# Patient Record
Sex: Female | Born: 1935 | Race: Black or African American | Hispanic: No | Smoking: Never smoker
Health system: Southern US, Community
[De-identification: ages and names within clinical notes are randomized; demographics above are authoritative.]

## PROBLEM LIST (undated history)

## (undated) DIAGNOSIS — J45909 Unspecified asthma, uncomplicated: Secondary | ICD-10-CM

## (undated) DIAGNOSIS — E7800 Pure hypercholesterolemia, unspecified: Secondary | ICD-10-CM

## (undated) DIAGNOSIS — I10 Essential (primary) hypertension: Secondary | ICD-10-CM

---

## 2020-01-24 ENCOUNTER — Other Ambulatory Visit: Payer: Self-pay

## 2020-01-24 ENCOUNTER — Emergency Department (HOSPITAL_BASED_OUTPATIENT_CLINIC_OR_DEPARTMENT_OTHER)
Admission: EM | Admit: 2020-01-24 | Discharge: 2020-01-24 | Disposition: A | Discharge status: Home / Self Care | Payer: Medicare Other | Attending: Emergency Medicine | Admitting: Emergency Medicine

## 2020-01-24 ENCOUNTER — Encounter (HOSPITAL_BASED_OUTPATIENT_CLINIC_OR_DEPARTMENT_OTHER): Payer: Self-pay | Admitting: Emergency Medicine

## 2020-01-24 ENCOUNTER — Emergency Department (HOSPITAL_BASED_OUTPATIENT_CLINIC_OR_DEPARTMENT_OTHER): Payer: Medicare Other

## 2020-01-24 DIAGNOSIS — J45909 Unspecified asthma, uncomplicated: Secondary | ICD-10-CM | POA: Insufficient documentation

## 2020-01-24 DIAGNOSIS — D649 Anemia, unspecified: Secondary | ICD-10-CM | POA: Diagnosis not present

## 2020-01-24 DIAGNOSIS — R0602 Shortness of breath: Secondary | ICD-10-CM | POA: Diagnosis present

## 2020-01-24 DIAGNOSIS — Z79899 Other long term (current) drug therapy: Secondary | ICD-10-CM | POA: Insufficient documentation

## 2020-01-24 DIAGNOSIS — I10 Essential (primary) hypertension: Secondary | ICD-10-CM | POA: Diagnosis not present

## 2020-01-24 HISTORY — DX: Essential (primary) hypertension: I10

## 2020-01-24 HISTORY — DX: Pure hypercholesterolemia, unspecified: E78.00

## 2020-01-24 HISTORY — DX: Unspecified asthma, uncomplicated: J45.909

## 2020-01-24 LAB — CBC WITH DIFFERENTIAL/PLATELET
Abs Immature Granulocytes: 0.09 10*3/uL — ABNORMAL HIGH (ref 0.00–0.07)
Basophils Absolute: 0.1 10*3/uL (ref 0.0–0.1)
Basophils Relative: 0 %
Eosinophils Absolute: 0.2 10*3/uL (ref 0.0–0.5)
Eosinophils Relative: 2 %
HCT: 26 % — ABNORMAL LOW (ref 36.0–46.0)
Hemoglobin: 7.5 g/dL — ABNORMAL LOW (ref 12.0–15.0)
Immature Granulocytes: 1 %
Lymphocytes Relative: 16 %
Lymphs Abs: 2.2 10*3/uL (ref 0.7–4.0)
MCH: 20.9 pg — ABNORMAL LOW (ref 26.0–34.0)
MCHC: 28.8 g/dL — ABNORMAL LOW (ref 30.0–36.0)
MCV: 72.4 fL — ABNORMAL LOW (ref 80.0–100.0)
Monocytes Absolute: 1.3 10*3/uL — ABNORMAL HIGH (ref 0.1–1.0)
Monocytes Relative: 9 %
Neutro Abs: 10.1 10*3/uL — ABNORMAL HIGH (ref 1.7–7.7)
Neutrophils Relative %: 72 %
Platelets: 619 10*3/uL — ABNORMAL HIGH (ref 150–400)
RBC: 3.59 MIL/uL — ABNORMAL LOW (ref 3.87–5.11)
RDW: 16.1 % — ABNORMAL HIGH (ref 11.5–15.5)
WBC: 13.9 10*3/uL — ABNORMAL HIGH (ref 4.0–10.5)
nRBC: 0 % (ref 0.0–0.2)

## 2020-01-24 LAB — BASIC METABOLIC PANEL
Anion gap: 11 (ref 5–15)
BUN: 21 mg/dL (ref 8–23)
CO2: 22 mmol/L (ref 22–32)
Calcium: 9.5 mg/dL (ref 8.9–10.3)
Chloride: 107 mmol/L (ref 98–111)
Creatinine, Ser: 1.23 mg/dL — ABNORMAL HIGH (ref 0.44–1.00)
GFR calc Af Amer: 46 mL/min — ABNORMAL LOW (ref >60)
GFR calc non Af Amer: 40 mL/min — ABNORMAL LOW (ref >60)
Glucose, Bld: 97 mg/dL (ref 70–99)
Potassium: 4.6 mmol/L (ref 3.5–5.1)
Sodium: 140 mmol/L (ref 135–145)

## 2020-01-24 LAB — TROPONIN I (HIGH SENSITIVITY)
Troponin I (High Sensitivity): 6 ng/L (ref <18)
Troponin I (High Sensitivity): 6 ng/L (ref <18)

## 2020-01-24 NOTE — ED Provider Notes (Signed)
[facility] EMERGENCY DEPARTMENT Provider Note   CSN: [id number] Arrival date & time: 01/24/20  1036     History Chief Complaint  Patient presents with  . Shortness of Breath    Bailey Edwards is a 86 y.o. female.  86 yo F with a chief complaints of shortness of breath on exertion.  This is been ongoing for many months.  She thinks at least more than 2.  She has been having some chest discomfort with it as well.  She has a follow-up appointment with a cardiologist in about a month's time but her family insisted that she come a bit sooner.  She does not think it is gotten significantly worse recently.  She denies history of prior MI has a history of hypertension hyperlipidemia denies smoking or family history of MI.  The history is provided by the patient.  Shortness of Breath Severity:  Moderate Onset quality:  Gradual Duration:  2 months Timing:  Intermittent Progression:  Waxing and waning Chronicity:  New Relieved by:  Rest Worsened by:  Activity and exertion Associated symptoms: chest pain   Associated symptoms: no fever, no headaches, no vomiting and no wheezing        Past Medical History:  Diagnosis Date  . Asthma   . High cholesterol   . Hypertension     There are no problems to display for this patient.   History reviewed. No pertinent surgical history.   OB History   No obstetric history on file.     No family history on file.  Social History   Tobacco Use  . Smoking status: Never Smoker  . Smokeless tobacco: Never Used  Substance Use Topics  . Alcohol use: Not Currently  . Drug use: Not on file    Home Medications Prior to Admission medications   Medication Sig Start Date End Date Taking? Authorizing Provider  albuterol (VENTOLIN HFA) 108 (90 Base) MCG/ACT inhaler Inhale into the lungs every 6 (six) hours as needed for wheezing or shortness of breath.   Yes [provider]  atorvastatin (LIPITOR) 20 MG tablet Take 20 mg  by mouth daily.   Yes [provider]  furosemide (LASIX) 20 MG tablet Take 20 mg by mouth.   Yes [provider]  metoprolol succinate (TOPROL-XL) 50 MG 24 hr tablet Take 50 mg by mouth daily. Take with or immediately following a meal.   Yes [provider]  montelukast (SINGULAIR) 10 MG tablet Take 10 mg by mouth at bedtime.   Yes [provider]  potassium chloride (MICRO-K) 10 MEQ CR capsule Take 10 mEq by mouth 2 (two) times daily.   Yes [provider]    Allergies    Patient has no known allergies.  Review of Systems   Review of Systems  Constitutional: Negative for chills and fever.  HENT: Negative for congestion and rhinorrhea.   Eyes: Negative for redness and visual disturbance.  Respiratory: Positive for shortness of breath. Negative for wheezing.   Cardiovascular: Positive for chest pain. Negative for palpitations.  Gastrointestinal: Negative for nausea and vomiting.  Genitourinary: Negative for dysuria and urgency.  Musculoskeletal: Negative for arthralgias and myalgias.  Skin: Negative for pallor and wound.  Neurological: Negative for dizziness and headaches.    Physical Exam Updated Vital Signs BP (!) 114/52 (BP Location: Right Arm)   Pulse 82   Temp 97.9 F (36.6 C) (Oral)   Resp 16   Ht 5\' 7"  (1.702 m)  Wt 75.6 kg   SpO2 99%   BMI 26.11 kg/m   Physical Exam Vitals and nursing note reviewed.  Constitutional:      General: She is not in acute distress.    Appearance: She is well-developed. She is not diaphoretic.  HENT:     Head: Normocephalic and atraumatic.  Eyes:     Pupils: Pupils are equal, round, and reactive to light.  Cardiovascular:     Rate and Rhythm: Normal rate and regular rhythm.     Heart sounds: No murmur heard.  No friction rub. No gallop.   Pulmonary:     Effort: Pulmonary effort is normal.     Breath sounds: No wheezing, rhonchi or rales.  Abdominal:     General: There is no  distension.     Palpations: Abdomen is soft.     Tenderness: There is no abdominal tenderness.  Musculoskeletal:        General: No tenderness.     Cervical back: Normal range of motion and neck supple.  Skin:    General: Skin is warm and dry.  Neurological:     Mental Status: She is alert and oriented to person, place, and time.  Psychiatric:        Behavior: Behavior normal.     ED Results / Procedures / Treatments   Labs (all labs ordered are listed, but only abnormal results are displayed) Labs Reviewed  CBC WITH DIFFERENTIAL/PLATELET - Abnormal; Notable for the following components:      Result Value   WBC 13.9 (*)    RBC 3.59 (*)    Hemoglobin 7.5 (*)    HCT 26.0 (*)    MCV 72.4 (*)    MCH 20.9 (*)    MCHC 28.8 (*)    RDW 16.1 (*)    Platelets 619 (*)    Neutro Abs 10.1 (*)    Monocytes Absolute 1.3 (*)    Abs Immature Granulocytes 0.09 (*)    All other components within normal limits  BASIC METABOLIC PANEL - Abnormal; Notable for the following components:   Creatinine, Ser 1.23 (*)    GFR calc non Af Amer 40 (*)    GFR calc Af Amer 46 (*)    All other components within normal limits  TROPONIN I (HIGH SENSITIVITY)  TROPONIN I (HIGH SENSITIVITY)    EKG EKG Interpretation  Date/Time:  Thursday January 24 2020 10:54:19 EDT Ventricular Rate:  96 PR Interval:  160 QRS Duration: 78 QT Interval:  340 QTC Calculation: 429 R Axis:   -5 Text Interpretation: Normal sinus rhythm Low voltage QRS Cannot rule out Anteroseptal infarct , age undetermined Abnormal ECG Baseline wander TECHNICALLY DIFFICULT Otherwise no significant change Confirmed by Deno Etienne [phone]) on 01/24/2020 11:05:52 AM   Radiology DG Chest 2 View  Result Date: 01/24/2020 CLINICAL DATA:  2 month history of shortness of breath. EXAM: CHEST - 2 VIEW COMPARISON:  None. FINDINGS: The lungs are clear without focal pneumonia, edema, pneumothorax or pleural effusion. The cardiopericardial silhouette is  within normal limits for size. The visualized bony structures of the thorax are intact. IMPRESSION: No active cardiopulmonary disease. Electronically Signed   By: Misty Stanley M.D.   On: 01/24/2020 11:42    Procedures Procedures (including critical care time)  Medications Ordered in ED Medications - No data to display  ED Course  I have reviewed the triage vital signs and the nursing notes.  Pertinent labs & imaging results that were available during  my care of the patient were reviewed by me and considered in my medical decision making (see chart for details).    MDM Rules/Calculators/A&P                          86 yo F with a chief complaints of shortness of breath and chest pain on exertion.  Going on for months now without significant change recently.  Found to be anemic.  I have no known baseline.  Hemoglobin is 7.5.  Could explain her symptoms.  She denies dark stool or blood in her stool.  Knows that she does have a history of anemia but not sure what her normal lab value is.  I discussed coming into the hospital with her but she would prefer to follow-up as an outpatient.  Initial troponin is negative.  Will wait for a second.  Delta is negative.  Discharge home.  3:44 PM:  I have discussed the diagnosis/risks/treatment options with the patient and family and believe the pt to be eligible for discharge home to follow-up with PCP. We also discussed returning to the ED immediately if new or worsening sx occur. We discussed the sx which are most concerning (e.g., sudden worsening pain, fever, inability to tolerate by mouth) that necessitate immediate return. Medications administered to the patient during their visit and any new prescriptions provided to the patient are listed below.  Medications given during this visit Medications - No data to display   The patient appears reasonably screen and/or stabilized for discharge and I doubt any other medical condition or other [facility]  requiring further screening, evaluation, or treatment in the ED at this time prior to discharge.    Final Clinical Impression(s) / ED Diagnoses Final diagnoses:  Symptomatic anemia    Rx / DC Orders ED Discharge Orders    None       Melene Plan, DO 01/24/20 1544

## 2020-01-24 NOTE — Discharge Instructions (Signed)
Call your doctor tomorrow and discuss with them your visit here.  Please return for any worsening chest pain or shortness of breath.

## 2020-01-24 NOTE — ED Triage Notes (Signed)
SOB x a couple months worse with exertion with mild chest pain.

## 2020-01-24 NOTE — ED Notes (Signed)
IV attempted x1 without success

## 2020-04-18 ENCOUNTER — Encounter (HOSPITAL_BASED_OUTPATIENT_CLINIC_OR_DEPARTMENT_OTHER): Payer: Self-pay | Admitting: Emergency Medicine

## 2020-04-18 ENCOUNTER — Emergency Department (HOSPITAL_BASED_OUTPATIENT_CLINIC_OR_DEPARTMENT_OTHER)
Admission: EM | Admit: 2020-04-18 | Discharge: 2020-04-18 | Disposition: A | Discharge status: Home / Self Care | Payer: Medicare Other | Attending: Emergency Medicine | Admitting: Emergency Medicine

## 2020-04-18 ENCOUNTER — Emergency Department (HOSPITAL_BASED_OUTPATIENT_CLINIC_OR_DEPARTMENT_OTHER): Payer: Medicare Other

## 2020-04-18 ENCOUNTER — Other Ambulatory Visit: Payer: Self-pay

## 2020-04-18 DIAGNOSIS — I10 Essential (primary) hypertension: Secondary | ICD-10-CM | POA: Diagnosis not present

## 2020-04-18 DIAGNOSIS — M545 Low back pain, unspecified: Secondary | ICD-10-CM

## 2020-04-18 DIAGNOSIS — Z79899 Other long term (current) drug therapy: Secondary | ICD-10-CM | POA: Diagnosis not present

## 2020-04-18 DIAGNOSIS — J45909 Unspecified asthma, uncomplicated: Secondary | ICD-10-CM | POA: Diagnosis not present

## 2020-04-18 MED ORDER — IBUPROFEN 400 MG PO TABS
400.0000 mg | ORAL_TABLET | Freq: Once | ORAL | Status: AC
  Administered 2020-04-18: 400 mg via ORAL
  Filled 2020-04-18: qty 1

## 2020-04-18 MED ORDER — CYCLOBENZAPRINE HCL 5 MG PO TABS
5.0000 mg | ORAL_TABLET | Freq: Two times a day (BID) | ORAL | 0 refills | Status: AC | PRN
Start: 2020-04-18 — End: ?

## 2020-04-18 MED ORDER — OXYCODONE HCL 5 MG PO TABS
5.0000 mg | ORAL_TABLET | Freq: Once | ORAL | Status: AC
  Administered 2020-04-18: 5 mg via ORAL
  Filled 2020-04-18: qty 1

## 2020-04-18 MED ORDER — CYCLOBENZAPRINE HCL 5 MG PO TABS
5.0000 mg | ORAL_TABLET | Freq: Once | ORAL | Status: AC
  Administered 2020-04-18: 5 mg via ORAL
  Filled 2020-04-18: qty 1

## 2020-04-18 NOTE — Discharge Instructions (Addendum)
Recommend 400 mg of Motrin every 8 hours as needed for pain.  Recommend 1000 mg of Tylenol every 6 hours as needed for pain.  Recommend buying 4% lidocaine patches over-the-counter as well.  Use Flexeril as needed but this medicine can make you sleepy so be careful while using it.  Follow-up with your primary care doctor.

## 2020-04-18 NOTE — ED Triage Notes (Signed)
Was  Backing  Up she states and fell backwards did  Hit head but did sit down hard she states   Hurts right in the middle of lower back and butt

## 2020-04-18 NOTE — ED Provider Notes (Signed)
[facility] EMERGENCY DEPARTMENT Provider Note   CSN: [id number] Arrival date & time: 04/18/20  0998     History Chief Complaint  Patient presents with   Back Pain    Bailey Edwards is a 86 y.o. female.  The history is provided by the patient.  Back Pain Location:  Lumbar spine Quality:  Aching Pain severity:  Mild Onset quality:  Gradual Timing:  Intermittent Progression:  Waxing and waning Chronicity:  New Context comment:  Fall yesterday morning, low back pain throughout day, worse after waking up this morning. No LOC, did not hit head. No weakness or numhbness of leg Relieved by:  Nothing Worsened by:  Ambulation Ineffective treatments:  OTC medications Associated symptoms: no abdominal pain, no abdominal swelling, no bladder incontinence, no bowel incontinence, no chest pain, no dysuria, no fever, no headaches, no leg pain, no numbness, no paresthesias, no pelvic pain, no perianal numbness, no tingling, no weakness and no weight loss        Past Medical History:  Diagnosis Date   Asthma    High cholesterol    Hypertension     There are no problems to display for this patient.   History reviewed. No pertinent surgical history.   OB History   No obstetric history on file.     No family history on file.  Social History   Tobacco Use   Smoking status: Never Smoker   Smokeless tobacco: Never Used  Substance Use Topics   Alcohol use: Not Currently   Drug use: Not on file    Home Medications Prior to Admission medications   Medication Sig Start Date End Date Taking? Authorizing Provider  albuterol (VENTOLIN HFA) 108 (90 Base) MCG/ACT inhaler Inhale into the lungs every 6 (six) hours as needed for wheezing or shortness of breath.    [provider]  atorvastatin (LIPITOR) 20 MG tablet Take 20 mg by mouth daily.    [provider]  cyclobenzaprine (FLEXERIL) 5 MG tablet Take 1 tablet (5 mg total) by mouth 2 (two) times  daily as needed for up to 10 doses for muscle spasms. 04/18/20   Emmerie Battaglia, DO  furosemide (LASIX) 20 MG tablet Take 20 mg by mouth.    [provider]  metoprolol succinate (TOPROL-XL) 50 MG 24 hr tablet Take 50 mg by mouth daily. Take with or immediately following a meal.    [provider]  montelukast (SINGULAIR) 10 MG tablet Take 10 mg by mouth at bedtime.    [provider]  potassium chloride (MICRO-K) 10 MEQ CR capsule Take 10 mEq by mouth 2 (two) times daily.    [provider]    Allergies    Patient has no known allergies.  Review of Systems   Review of Systems  Constitutional: Negative for fever and weight loss.  Cardiovascular: Negative for chest pain.  Gastrointestinal: Negative for abdominal pain and bowel incontinence.  Genitourinary: Negative for bladder incontinence, dysuria and pelvic pain.  Musculoskeletal: Positive for back pain.  Neurological: Negative for tingling, weakness, numbness, headaches and paresthesias.    Physical Exam Updated Vital Signs BP 113/70    Pulse 85    Temp 99.6 F (37.6 C)    Resp 18    SpO2 99%   Physical Exam Vitals and nursing note reviewed.  Constitutional:      General: She is not in acute distress.    Appearance: She is well-developed. She is not ill-appearing.  HENT:  Head: Normocephalic and atraumatic.  Eyes:     Extraocular Movements: Extraocular movements intact.     Conjunctiva/sclera: Conjunctivae normal.     Pupils: Pupils are equal, round, and reactive to light.  Cardiovascular:     Rate and Rhythm: Normal rate and regular rhythm.     Pulses: Normal pulses.     Heart sounds: No murmur heard.   Pulmonary:     Effort: Pulmonary effort is normal. No respiratory distress.     Breath sounds: Normal breath sounds.  Abdominal:     Palpations: Abdomen is soft.     Tenderness: There is no abdominal tenderness.  Musculoskeletal:        General: Tenderness present. Normal range  of motion.     Cervical back: Normal range of motion and neck supple. No tenderness.     Comments: No specific midline spinal tenderness, tenderness to bilateral paraspinal muscles in the lumbar region with increased tone  Skin:    General: Skin is warm and dry.  Neurological:     General: No focal deficit present.     Mental Status: She is alert and oriented to person, place, and time.     Cranial Nerves: No cranial nerve deficit.     Sensory: No sensory deficit.     Motor: No weakness.     Comments: 5+ out of 5 strength throughout, normal sensation     ED Results / Procedures / Treatments   Labs (all labs ordered are listed, but only abnormal results are displayed) Labs Reviewed - No data to display  EKG None  Radiology DG Lumbar Spine Complete  Result Date: 04/18/2020 CLINICAL DATA:  Was Backing Up she states and fell backwards did Hit head but did sit down hard she states Hurts right in the middle of lower back and butt EXAM: LUMBAR SPINE - COMPLETE 4+ VIEW COMPARISON:  None. FINDINGS: No fracture or bone lesion. Slight anterolisthesis of L3 on L4 and L4 on L5. Moderate loss of disc height at L5-S1. Remaining lumbar disc spaces are relatively well preserved. There are minor endplate osteophytes. Facet degenerative changes noted at L4-L5 and L5-S1. Scattered aortic and iliac artery vascular calcifications. Soft tissues are otherwise unremarkable. IMPRESSION: 1. No fracture or acute finding. 2. Degenerative changes as detailed. Electronically Signed   By: Amie Portland M.D.   On: 04/18/2020 10:08   DG Pelvis 1-2 Views  Result Date: 04/18/2020 CLINICAL DATA:  Was Backing Up she states and fell backwards did Hit head but did sit down hard she states Hurts right in the middle of lower back and butt EXAM: PELVIS - 1-2 VIEW COMPARISON:  Bone survey, 01/25/2011 FINDINGS: No fracture.  No bone lesion. Bilateral medial hip joint space narrowing. There is also mild narrowing of the right hip  joints superior laterally. Small marginal osteophytes from the base of the right femoral head. SI joints and symphysis pubis are normally spaced and aligned. Soft tissues are unremarkable. IMPRESSION: 1. No fracture or acute finding.  No bone lesion. Electronically Signed   By: Amie Portland M.D.   On: 04/18/2020 10:10    Procedures Procedures (including critical care time)  Medications Ordered in ED Medications  oxyCODONE (Oxy IR/ROXICODONE) immediate release tablet 5 mg (5 mg Oral Given 04/18/20 1055)  ibuprofen (ADVIL) tablet 400 mg (400 mg Oral Given 04/18/20 1055)  cyclobenzaprine (FLEXERIL) tablet 5 mg (5 mg Oral Given 04/18/20 1055)    ED Course  I have reviewed the triage vital signs  and the nursing notes.  Pertinent labs & imaging results that were available during my care of the patient were reviewed by me and considered in my medical decision making (see chart for details).    MDM Rules/Calculators/A&P                          Bailey Edwards is an 86 year old female who presents to the ED after fall yesterday.  Normal vitals.  No fever.  Patient having mostly low back pain.  No midline spinal tenderness.  No hip pain specifically.  Has been able to ambulate but with pain.  Mostly paraspinal lumbar muscle tenderness on exam.  Increased tone.  Suspect muscle spasm.  Will get x-rays of the lumbar spine and pelvis.  Will give oxycodone, ibuprofen, Flexeril.  Patient with no fractures on x-rays.  Patient did not hit her head or lose consciousness.  She is not on blood thinners.  Neurologically intact.  No concern for intracranial process.  Overall suspect muscle spasm.  Recommend continued use of Tylenol, Motrin.  Will prescribe Flexeril.  Discharged in good condition.  This chart was dictated using voice recognition software.  Despite best efforts to proofread,  errors can occur which can change the documentation meaning.    Final Clinical Impression(s) / ED Diagnoses Final diagnoses:    None    Rx / DC Orders ED Discharge Orders         Ordered    cyclobenzaprine (FLEXERIL) 5 MG tablet  2 times daily PRN        04/18/20 1035           Domonique Brouillard, DO 04/18/20 1107

## 2020-08-02 DEATH — deceased

## 2021-10-17 IMAGING — CR DG PELVIS 1-2V
1 series · 1 of 1 positions shown · non-contrast
Comparison: Bone survey, 01/25/2011

CLINICAL DATA: Was Backing Up she states and fell backwards did Hit
head but did sit down hard she states Hurts right in the middle of
lower back and butt

EXAM:
PELVIS - 1-2 VIEW

[t pelvis a.p.]
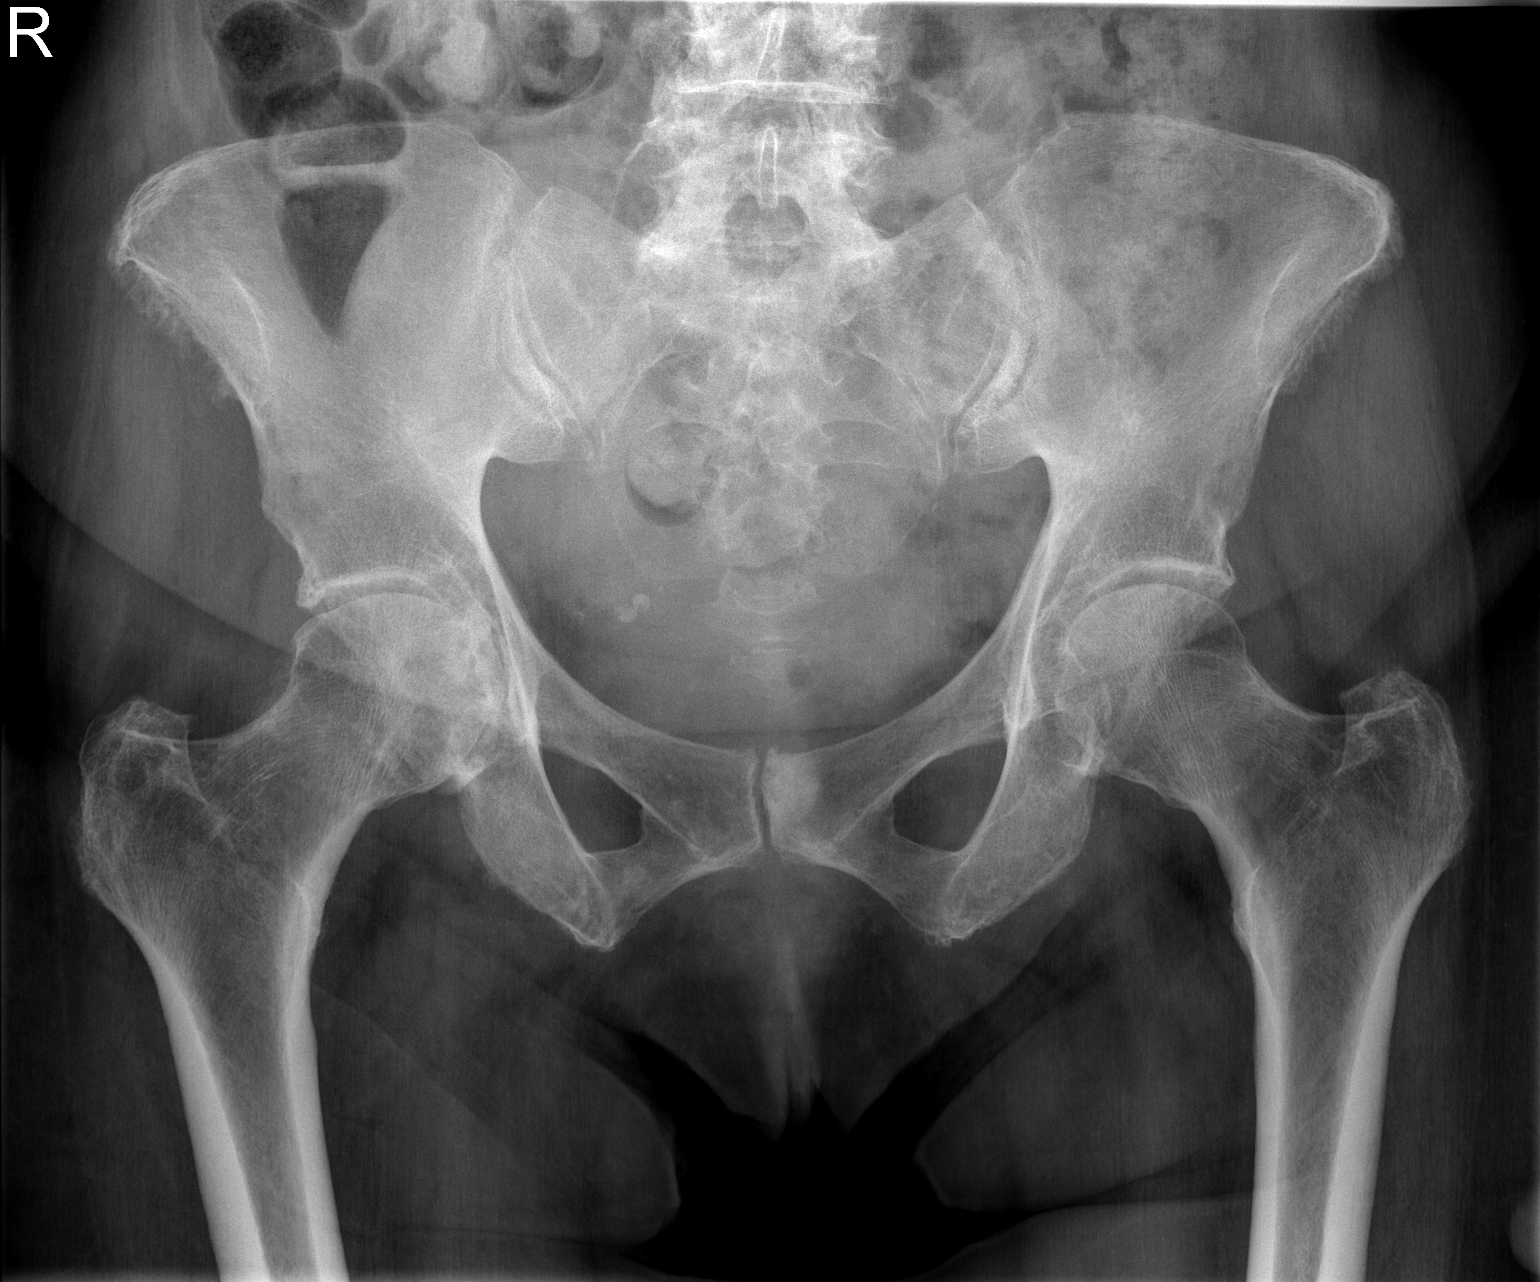

[1 of 1 positions shown; findings below may reference images not displayed]

FINDINGS: No fracture.  No bone lesion.

Bilateral medial hip joint space narrowing. There is also mild
narrowing of the right hip joints superior laterally. Small marginal
osteophytes from the base of the right femoral head.

SI joints and symphysis pubis are normally spaced and aligned.

Soft tissues are unremarkable.
IMPRESSION: 1. No fracture or acute finding.  No bone lesion.

## 2021-10-17 IMAGING — CR DG LUMBAR SPINE COMPLETE 4+V
5 series · 5 of 5 positions shown · non-contrast
Comparison: None.

CLINICAL DATA: Was Backing Up she states and fell backwards did Hit
head but did sit down hard she states Hurts right in the middle of
lower back and butt

EXAM:
LUMBAR SPINE - COMPLETE 4+ VIEW

[t l-spine a.p.]
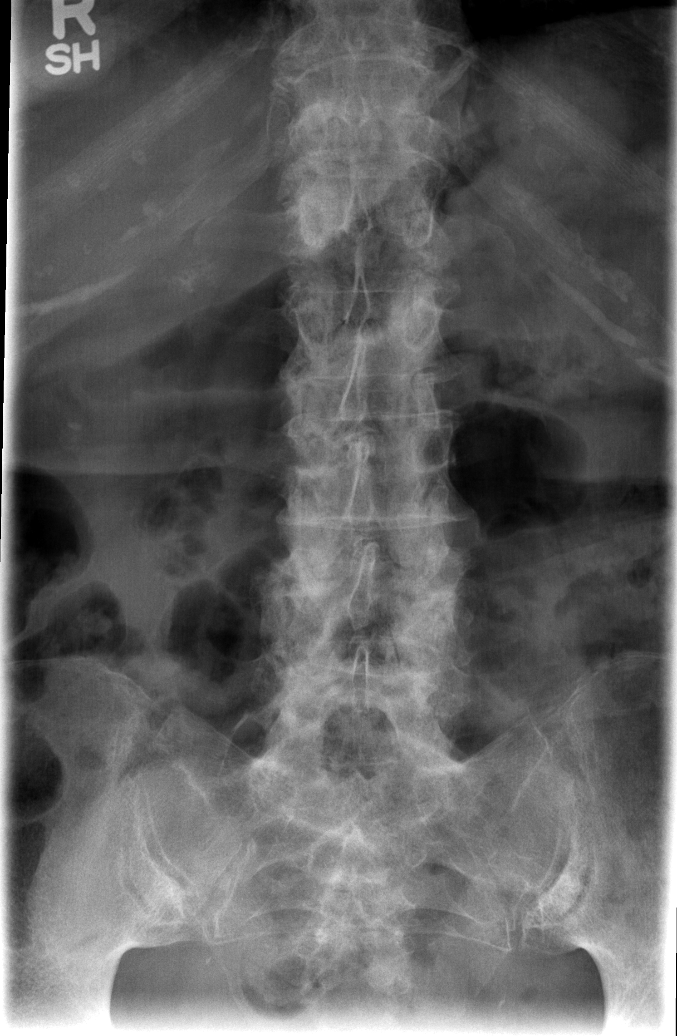

[t l-spine oblique exposure (1 of 2)]
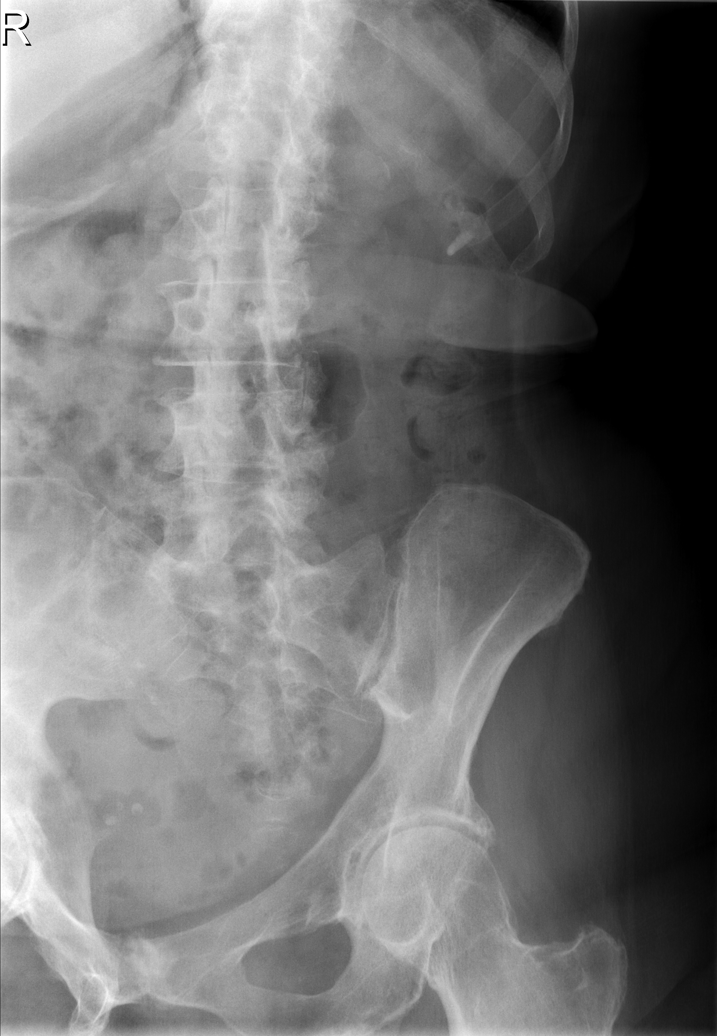

[t l-spine oblique exposure (2 of 2)]
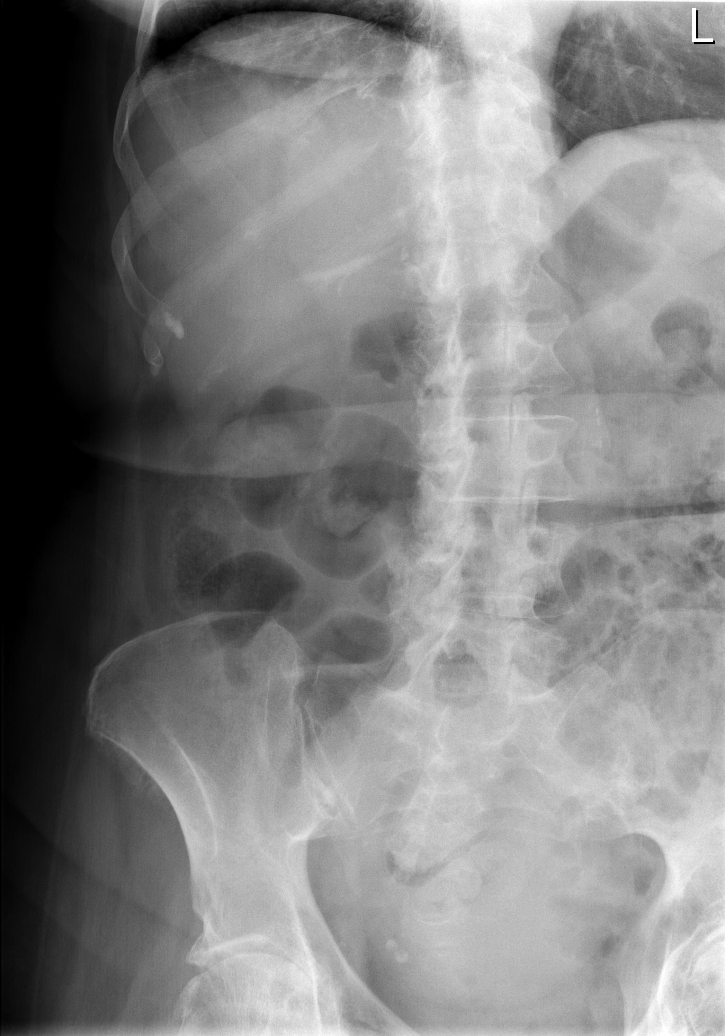

[t l-spine lat]
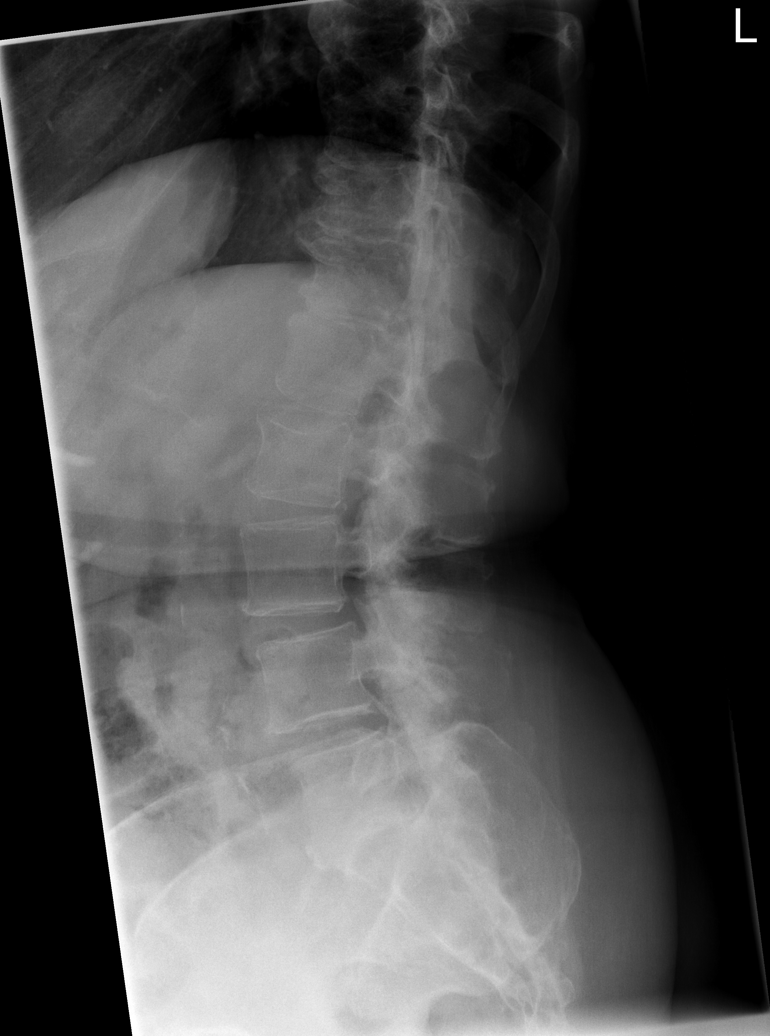

[t l-spine l5-s1 spot]
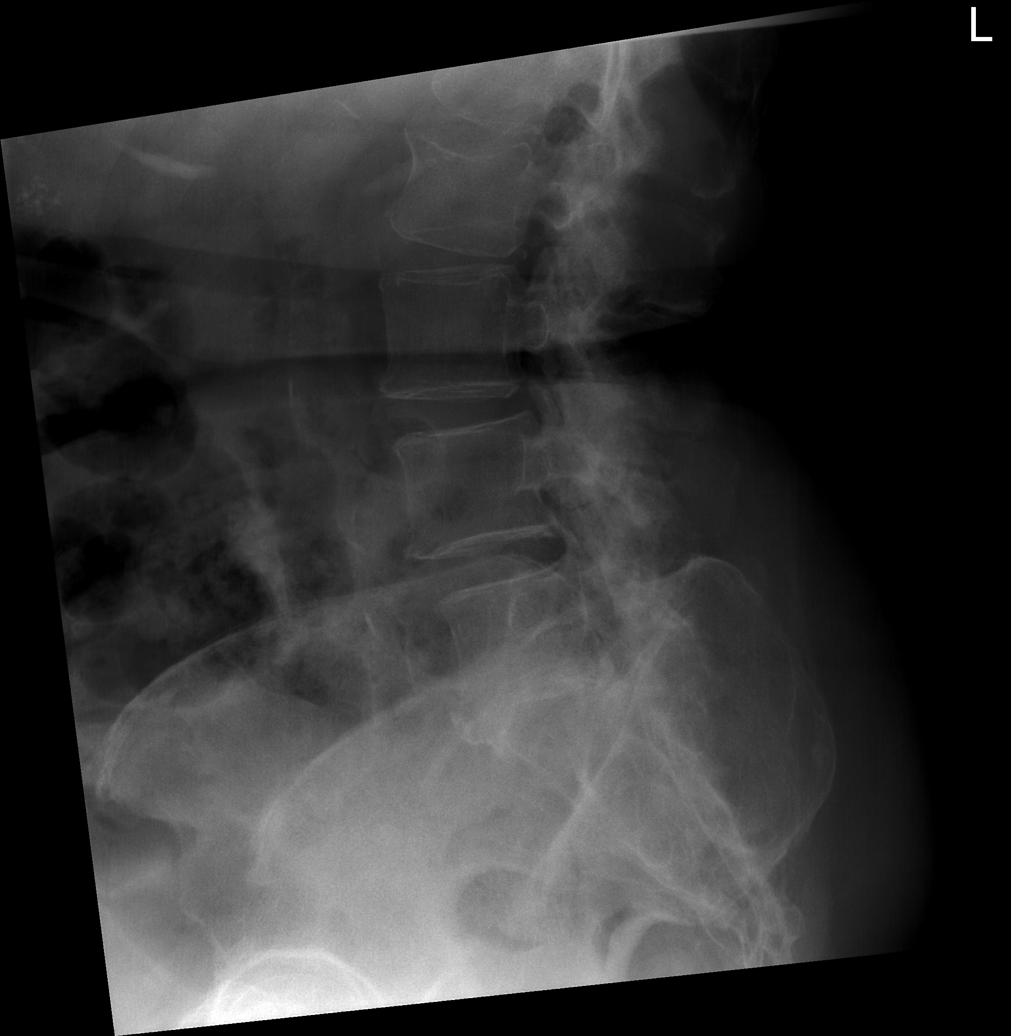

[5 of 5 positions shown; findings below may reference images not displayed]

FINDINGS: No fracture or bone lesion.

Slight anterolisthesis of L3 on L4 and L4 on L5.

Moderate loss of disc height at L5-S1. Remaining lumbar disc spaces
are relatively well preserved. There are minor endplate osteophytes.
Facet degenerative changes noted at L4-L5 and L5-S1.

Scattered aortic and iliac artery vascular calcifications. Soft
tissues are otherwise unremarkable.
IMPRESSION: 1. No fracture or acute finding.
2. Degenerative changes as detailed.
